# Patient Record
Sex: Female | Born: 1940 | Race: White | Hispanic: No
Health system: Southern US, Community
[De-identification: ages and names within clinical notes are randomized; demographics above are authoritative.]

## PROBLEM LIST (undated history)

## (undated) DIAGNOSIS — F0390 Unspecified dementia without behavioral disturbance: Secondary | ICD-10-CM

---

## 2018-06-28 ENCOUNTER — Emergency Department (HOSPITAL_COMMUNITY): Payer: Medicare Other

## 2018-06-28 ENCOUNTER — Emergency Department (HOSPITAL_COMMUNITY)
Admission: EM | Admit: 2018-06-28 | Discharge: 2018-07-04 | Disposition: A | Discharge status: Other Acute Inpatient Hospital | Payer: Medicare Other | Attending: Emergency Medicine | Admitting: Emergency Medicine

## 2018-06-28 ENCOUNTER — Encounter (HOSPITAL_COMMUNITY): Payer: Self-pay | Admitting: Emergency Medicine

## 2018-06-28 ENCOUNTER — Other Ambulatory Visit: Payer: Self-pay

## 2018-06-28 DIAGNOSIS — G3109 Other frontotemporal dementia: Secondary | ICD-10-CM | POA: Diagnosis not present

## 2018-06-28 DIAGNOSIS — Z79899 Other long term (current) drug therapy: Secondary | ICD-10-CM | POA: Diagnosis not present

## 2018-06-28 DIAGNOSIS — F322 Major depressive disorder, single episode, severe without psychotic features: Secondary | ICD-10-CM | POA: Insufficient documentation

## 2018-06-28 DIAGNOSIS — R4689 Other symptoms and signs involving appearance and behavior: Secondary | ICD-10-CM

## 2018-06-28 DIAGNOSIS — F332 Major depressive disorder, recurrent severe without psychotic features: Secondary | ICD-10-CM

## 2018-06-28 DIAGNOSIS — F988 Other specified behavioral and emotional disorders with onset usually occurring in childhood and adolescence: Secondary | ICD-10-CM | POA: Insufficient documentation

## 2018-06-28 DIAGNOSIS — S0990XA Unspecified injury of head, initial encounter: Secondary | ICD-10-CM

## 2018-06-28 DIAGNOSIS — R45851 Suicidal ideations: Secondary | ICD-10-CM | POA: Insufficient documentation

## 2018-06-28 DIAGNOSIS — Z7982 Long term (current) use of aspirin: Secondary | ICD-10-CM | POA: Diagnosis not present

## 2018-06-28 DIAGNOSIS — S0081XA Abrasion of other part of head, initial encounter: Secondary | ICD-10-CM | POA: Diagnosis not present

## 2018-06-28 DIAGNOSIS — Y92129 Unspecified place in nursing home as the place of occurrence of the external cause: Secondary | ICD-10-CM | POA: Insufficient documentation

## 2018-06-28 DIAGNOSIS — Y939 Activity, unspecified: Secondary | ICD-10-CM | POA: Diagnosis not present

## 2018-06-28 DIAGNOSIS — W1839XA Other fall on same level, initial encounter: Secondary | ICD-10-CM | POA: Diagnosis not present

## 2018-06-28 DIAGNOSIS — E876 Hypokalemia: Secondary | ICD-10-CM

## 2018-06-28 DIAGNOSIS — Y999 Unspecified external cause status: Secondary | ICD-10-CM | POA: Diagnosis not present

## 2018-06-28 DIAGNOSIS — S0083XA Contusion of other part of head, initial encounter: Secondary | ICD-10-CM | POA: Diagnosis not present

## 2018-06-28 DIAGNOSIS — W19XXXA Unspecified fall, initial encounter: Secondary | ICD-10-CM

## 2018-06-28 DIAGNOSIS — Z23 Encounter for immunization: Secondary | ICD-10-CM | POA: Insufficient documentation

## 2018-06-28 HISTORY — DX: Unspecified dementia, unspecified severity, without behavioral disturbance, psychotic disturbance, mood disturbance, and anxiety: F03.90

## 2018-06-28 LAB — BASIC METABOLIC PANEL
ANION GAP: 12 (ref 5–15)
BUN: 20 mg/dL (ref 8–23)
CALCIUM: 8.9 mg/dL (ref 8.9–10.3)
CO2: 28 mmol/L (ref 22–32)
CREATININE: 0.93 mg/dL (ref 0.44–1.00)
Chloride: 99 mmol/L (ref 98–111)
GFR calc Af Amer: >60 mL/min (ref >60)
GFR, EST NON AFRICAN AMERICAN: 58 mL/min — AB (ref >60)
GLUCOSE: 122 mg/dL — AB (ref 70–99)
Potassium: 2.8 mmol/L — ABNORMAL LOW (ref 3.5–5.1)
Sodium: 139 mmol/L (ref 135–145)

## 2018-06-28 LAB — CBC WITH DIFFERENTIAL/PLATELET
Abs Immature Granulocytes: 0.02 10*3/uL (ref 0.00–0.07)
BASOS PCT: 1 %
Basophils Absolute: 0 10*3/uL (ref 0.0–0.1)
EOS ABS: 0.1 10*3/uL (ref 0.0–0.5)
EOS PCT: 2 %
HCT: 42 % (ref 36.0–46.0)
Hemoglobin: 14.1 g/dL (ref 12.0–15.0)
IMMATURE GRANULOCYTES: 0 %
Lymphocytes Relative: 29 %
Lymphs Abs: 1.7 10*3/uL (ref 0.7–4.0)
MCH: 29.5 pg (ref 26.0–34.0)
MCHC: 33.6 g/dL (ref 30.0–36.0)
MCV: 87.9 fL (ref 80.0–100.0)
MONOS PCT: 10 %
Monocytes Absolute: 0.6 10*3/uL (ref 0.1–1.0)
NEUTROS PCT: 58 %
NRBC: 0 % (ref 0.0–0.2)
Neutro Abs: 3.4 10*3/uL (ref 1.7–7.7)
PLATELETS: 198 10*3/uL (ref 150–400)
RBC: 4.78 MIL/uL (ref 3.87–5.11)
RDW: 13 % (ref 11.5–15.5)
WBC: 5.9 10*3/uL (ref 4.0–10.5)

## 2018-06-28 LAB — URINALYSIS, ROUTINE W REFLEX MICROSCOPIC
BACTERIA UA: NONE SEEN
Bilirubin Urine: NEGATIVE
Glucose, UA: NEGATIVE mg/dL
Ketones, ur: 5 mg/dL — AB
LEUKOCYTES UA: NEGATIVE
Nitrite: NEGATIVE
PH: 5 (ref 5.0–8.0)
Protein, ur: NEGATIVE mg/dL
SPECIFIC GRAVITY, URINE: 1.023 (ref 1.005–1.030)

## 2018-06-28 MED ORDER — POTASSIUM CHLORIDE CRYS ER 20 MEQ PO TBCR
40.0000 meq | EXTENDED_RELEASE_TABLET | Freq: Once | ORAL | Status: AC
  Administered 2018-07-01: 40 meq via ORAL
  Filled 2018-06-28 (×3): qty 2

## 2018-06-28 MED ORDER — POTASSIUM CHLORIDE 10 MEQ/100ML IV SOLN
10.0000 meq | INTRAVENOUS | Status: AC
  Administered 2018-06-28 (×2): 10 meq via INTRAVENOUS
  Filled 2018-06-28 (×2): qty 100

## 2018-06-28 MED ORDER — TETANUS-DIPHTH-ACELL PERTUSSIS 5-2.5-18.5 LF-MCG/0.5 IM SUSP
0.5000 mL | Freq: Once | INTRAMUSCULAR | Status: AC
  Administered 2018-06-28: 0.5 mL via INTRAMUSCULAR
  Filled 2018-06-28: qty 0.5

## 2018-06-28 MED ORDER — HALOPERIDOL LACTATE 5 MG/ML IJ SOLN
2.0000 mg | Freq: Once | INTRAMUSCULAR | Status: AC
  Administered 2018-06-28: 2 mg via INTRAVENOUS
  Filled 2018-06-28: qty 1

## 2018-06-28 MED ORDER — SODIUM CHLORIDE 0.9 % IV BOLUS
1000.0000 mL | Freq: Once | INTRAVENOUS | Status: AC
  Administered 2018-06-28: 1000 mL via INTRAVENOUS

## 2018-06-28 MED ORDER — LORAZEPAM 2 MG/ML IJ SOLN
1.0000 mg | INTRAMUSCULAR | Status: DC | PRN
  Administered 2018-07-01: 1 mg via INTRAMUSCULAR
  Filled 2018-06-28 (×3): qty 1

## 2018-06-28 MED ORDER — HALOPERIDOL LACTATE 5 MG/ML IJ SOLN
2.0000 mg | Freq: Four times a day (QID) | INTRAMUSCULAR | Status: DC | PRN
  Administered 2018-07-01 – 2018-07-03 (×4): 2 mg via INTRAMUSCULAR
  Filled 2018-06-28 (×4): qty 1

## 2018-06-28 MED ORDER — LORAZEPAM 2 MG/ML IJ SOLN
1.0000 mg | Freq: Once | INTRAMUSCULAR | Status: AC
  Administered 2018-06-28: 1 mg via INTRAVENOUS
  Filled 2018-06-28: qty 1

## 2018-06-28 NOTE — ED Triage Notes (Signed)
Per EMS, pt coming from [facility] nursing home with complaints of a fall after pt was seen walking quickly away from CNA staff and fell hitting her head leaving a hematoma above left eye. Pt has dementia and per staff she is at baseline now which is non verbal. Pt is not on blood thinners at this time. VSS.

## 2018-06-28 NOTE — Progress Notes (Signed)
CSW met with pt and pt's family members. Pt did not participate in conversation. CSW provided pt's family with [place] information. Reviewed that at this time, seeking geri-psych. Explained that pt may have to go back to [facility] and seek new memory care placement from there. Pt's family agreeable and understanding.   Wendelyn Breslow, [facility]  [phone]
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
## 2018-06-28 NOTE — ED Notes (Signed)
ED Provider at bedside. 

## 2018-06-28 NOTE — ED Notes (Signed)
Patient transported to CT 
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
## 2018-06-28 NOTE — Discharge Instructions (Addendum)
By Kathryne Sharper to [facility]

## 2018-06-29 MED ORDER — ASPIRIN 81 MG PO CHEW
81.0000 mg | CHEWABLE_TABLET | Freq: Every day | ORAL | Status: DC
  Administered 2018-06-29 – 2018-07-04 (×5): 81 mg via ORAL
  Filled 2018-06-29 (×6): qty 1

## 2018-06-29 MED ORDER — BUSPIRONE HCL 10 MG PO TABS
5.0000 mg | ORAL_TABLET | ORAL | Status: DC
  Filled 2018-06-29: qty 1

## 2018-06-29 MED ORDER — BUSPIRONE HCL 10 MG PO TABS
5.0000 mg | ORAL_TABLET | Freq: Two times a day (BID) | ORAL | Status: DC
  Administered 2018-06-29 – 2018-07-03 (×8): 5 mg via ORAL
  Filled 2018-06-29 (×7): qty 1

## 2018-06-29 MED ORDER — VITAMIN D3 50 MCG (2000 UT) PO TABS: 4000.0000 [IU] | ORAL_TABLET | Freq: Every day | ORAL | Status: DC

## 2018-06-29 MED ORDER — VORTIOXETINE HBR 5 MG PO TABS
5.0000 mg | ORAL_TABLET | Freq: Every day | ORAL | Status: DC
  Administered 2018-06-29 – 2018-07-04 (×5): 5 mg via ORAL
  Filled 2018-06-29 (×6): qty 1

## 2018-06-29 MED ORDER — SENNA-DOCUSATE SODIUM 8.6-50 MG PO TABS: 1.0000 | ORAL_TABLET | ORAL | Status: DC

## 2018-06-29 MED ORDER — HYDROCHLOROTHIAZIDE 25 MG PO TABS
25.0000 mg | ORAL_TABLET | Freq: Every day | ORAL | Status: DC
  Administered 2018-06-29 – 2018-07-04 (×6): 25 mg via ORAL
  Filled 2018-06-29 (×6): qty 1

## 2018-06-29 MED ORDER — CALCIUM CITRATE 250 MG PO TABS: 250.0000 mg | ORAL_TABLET | ORAL | Status: DC

## 2018-06-29 MED ORDER — PANTOPRAZOLE SODIUM 40 MG PO TBEC
40.0000 mg | DELAYED_RELEASE_TABLET | Freq: Every day | ORAL | Status: DC
  Administered 2018-06-29 – 2018-07-04 (×6): 40 mg via ORAL
  Filled 2018-06-29 (×6): qty 1

## 2018-06-29 MED ORDER — TRAZODONE HCL 100 MG PO TABS
100.0000 mg | ORAL_TABLET | Freq: Every day | ORAL | Status: DC
  Administered 2018-06-29 – 2018-07-02 (×3): 100 mg via ORAL
  Filled 2018-06-29 (×4): qty 1

## 2018-06-29 MED ORDER — POTASSIUM CHLORIDE CRYS ER 20 MEQ PO TBCR
40.0000 meq | EXTENDED_RELEASE_TABLET | Freq: Once | ORAL | Status: AC
  Administered 2018-06-29: 40 meq via ORAL
  Filled 2018-06-29: qty 2

## 2018-06-29 MED ORDER — TRAZODONE HCL 100 MG PO TABS
100.0000 mg | ORAL_TABLET | ORAL | Status: DC
  Filled 2018-06-29: qty 1

## 2018-06-29 MED ORDER — VITAMIN D 1000 UNITS PO TABS
4000.0000 [IU] | ORAL_TABLET | Freq: Every day | ORAL | Status: DC
  Administered 2018-06-29 – 2018-07-04 (×6): 4000 [IU] via ORAL
  Filled 2018-06-29 (×6): qty 4

## 2018-06-29 MED ORDER — LEVOTHYROXINE SODIUM 50 MCG PO TABS
50.0000 ug | ORAL_TABLET | Freq: Every day | ORAL | Status: DC
  Administered 2018-07-01 – 2018-07-02 (×2): 50 ug via ORAL
  Filled 2018-06-29 (×4): qty 1

## 2018-06-29 MED ORDER — ALPRAZOLAM 0.5 MG PO TABS
0.5000 mg | ORAL_TABLET | ORAL | Status: DC | PRN
  Administered 2018-07-02: 0.5 mg via ORAL

## 2018-06-29 MED ORDER — CALCIUM CITRATE 950 (200 CA) MG PO TABS
200.0000 mg | ORAL_TABLET | Freq: Two times a day (BID) | ORAL | Status: DC
  Administered 2018-06-29 – 2018-07-04 (×7): 200 mg via ORAL
  Filled 2018-06-29 (×10): qty 1

## 2018-06-29 MED ORDER — ALPRAZOLAM ER 1 MG PO TB24
3.0000 mg | ORAL_TABLET | ORAL | Status: DC
  Filled 2018-06-29: qty 3

## 2018-06-29 MED ORDER — SERTRALINE HCL 100 MG PO TABS
100.0000 mg | ORAL_TABLET | Freq: Every day | ORAL | Status: DC
  Administered 2018-06-29 – 2018-07-04 (×6): 100 mg via ORAL
  Filled 2018-06-29 (×6): qty 1

## 2018-06-29 MED ORDER — SENNOSIDES-DOCUSATE SODIUM 8.6-50 MG PO TABS: 1.0000 | ORAL_TABLET | ORAL | Status: DC | PRN

## 2018-06-29 MED ORDER — ACETAMINOPHEN 500 MG PO TABS: 500.0000 mg | ORAL_TABLET | Freq: Four times a day (QID) | ORAL | Status: DC | PRN

## 2018-06-29 NOTE — ED Notes (Signed)
Pt dinner tray ordered.

## 2018-06-29 NOTE — ED Notes (Signed)
If any issue please call [phone] son Aarion Kittrell and is POA

## 2018-06-29 NOTE — BH Assessment (Signed)
[facility] Assessment Progress Note  Clinician attempted to reassess pt. Spoke to RN, Tre', who asked pt if she would participate in reassessment. Pt said that she would NOT participate but simply saying "no". TTS will attempt to reassess later in the day with a face to face visit.   Johny Shock. Ladona Ridgel, MS, NCC, LPCA Counselor

## 2018-06-29 NOTE — ED Notes (Signed)
Family at bedside, eating food and medications given with assistance of family, pt in chair and cooperative at this time

## 2018-06-29 NOTE — ED Notes (Signed)
Rec'd call from patient assessment. Pt refused to complete TTS. When patient asked if she wanted to speak to the provider, pt replied "No," turning over and closing eyes to go back to sleep.

## 2018-06-29 NOTE — ED Notes (Signed)
wanded by security 

## 2018-06-29 NOTE — ED Notes (Signed)
Regular Diet was ordered for Breakfast. 

## 2018-06-29 NOTE — ED Notes (Signed)
Family at bedside. Pt ambulate to bathroom with family assist

## 2018-06-29 NOTE — ED Notes (Signed)
Regular diet lunch tray ordered 

## 2018-06-29 NOTE — BH Assessment (Signed)
NP Nira Conn recommends gero-psych inpatient treatment. Patient referred to the following hospitals: (pending review)  Pharmacist, hospital Medical  [facility] [place]

## 2018-06-29 NOTE — ED Notes (Signed)
Pt ate 100% of dinner tray, family present

## 2018-06-29 NOTE — ED Notes (Signed)
MD informed that pt is refusing meds and refusing to eat. Instructed to continue to encourage vortioxetine.

## 2018-06-29 NOTE — ED Notes (Signed)
Pt woke up and was walking around unit, pushed past RN to get through door but eventually redirected to closed unit, escorted back to bed by nursing staff, now resting in bed.

## 2018-06-30 MED ORDER — ENSURE ENLIVE PO LIQD
237.0000 mL | Freq: Two times a day (BID) | ORAL | Status: DC
  Administered 2018-06-30 – 2018-07-03 (×6): 237 mL via ORAL
  Filled 2018-06-30 (×7): qty 237

## 2018-06-30 MED ORDER — ALPRAZOLAM 0.5 MG PO TABS
1.0000 mg | ORAL_TABLET | Freq: Three times a day (TID) | ORAL | Status: DC
  Administered 2018-06-30 – 2018-07-04 (×9): 1 mg via ORAL
  Filled 2018-06-30 (×12): qty 2

## 2018-06-30 NOTE — ED Notes (Signed)
Magistrate verified she received the IVC papers.

## 2018-06-30 NOTE — ED Notes (Signed)
Son visited wpt. Pt did not wake up. Attempted to wake pt so may eat lunch - unable. Pt noted to be lying on bed, w/eyes closed. Respirations even, unlabored. Son advised he has Health Care POA paperwork that he will bring at next visitation time d/t brought General POA paperwork yesterday - copy of this faxed to [facility], copy sent to medical records, and copy placed on clipboard.

## 2018-06-30 NOTE — ED Notes (Signed)
Pt took meds - refuses to eat even after much encouragement.

## 2018-06-30 NOTE — ED Notes (Signed)
Pt ambulated to bathroom w/Sitter.

## 2018-06-30 NOTE — ED Notes (Signed)
Pt drank half of her ensure.

## 2018-06-30 NOTE — ED Notes (Signed)
Re-TTS completed.  

## 2018-06-30 NOTE — ED Notes (Signed)
Pt ambulating in hallway w/Sitter.

## 2018-06-30 NOTE — ED Notes (Signed)
Laurie Hartman visited mother.

## 2018-06-30 NOTE — ED Notes (Signed)
Patient is resting with sitting at bedside

## 2018-06-30 NOTE — ED Notes (Signed)
Pt's family leaving at this time. Pt ate 100% of dinner and apple sauce. Pt continues not to speak.

## 2018-06-30 NOTE — Progress Notes (Signed)
According to Assunta Found, NP, the patient has been recommended for inpatient gero-psych services.   Disposition CSW contacted the patient's son/Healthcare POA, Tiwanna Tuch [phone]) to determine whether or not he would like his mother to be IVC'd due to her dementia diagnosis. According to the patient's son, he is agreeable with his mother being IVC'd. Patient's son reports he would like an update once his mother has been accepted to a facility   Disposition CSW will continue to follow for possible placement and updates.    Jerrol Banana, RN notified.     Baldo Daub, MSW, LCSWA Clinical Social Worker [facility]  Phone: [phone]

## 2018-06-30 NOTE — ED Notes (Signed)
Patient wandering unit and enters another patients room; sitters attempted to redirect patient out of room patient beings attempting to strike staff; RN and EDP intervene and able to redirect patient back to her room; pt is  Lying in bed at this time-Monique,RN

## 2018-06-30 NOTE — ED Notes (Signed)
Patient pacing the unit at this time; patient pushes and shoving sitter to get out of door; pt was able to be redirect back to her room-Monique,RN

## 2018-06-30 NOTE — BH Assessment (Signed)
06/30/2018:  Reassessment Patient awake and alert, however would not respond to questions.  Nurse reports Patient is not communicating with them, nor will she eat.  Nurse reports Patient will open her eyes and believes she can hear what is said to her.    Per Assunta Found, NP; Patient needs geropsych placement.

## 2018-06-30 NOTE — ED Notes (Signed)
IVC papers were served.  Original was placed in magistrate folder.  Copy faxed to [facility]  1 copy to medical records and 3 copies on patient's clipboard.

## 2018-06-30 NOTE — ED Notes (Signed)
Pt's son, Shon Baton, and significant other - Elizabeth visiting w/pt. Son brought in Health Care POA paperwork - copy faxed to [facility], copy sent to Medical Records, copy placed on clipboard.

## 2018-06-30 NOTE — ED Notes (Signed)
Pt woke, ambulated to bathroom w/Sitter and back to room. Sitter is assisting pt w/eating.

## 2018-07-01 NOTE — ED Notes (Signed)
Pt noted w/discoloration to face - bil eyes, nose. No open areas noted.

## 2018-07-01 NOTE — ED Notes (Signed)
Pt continues to attempt to leave ED - staff redirecting.

## 2018-07-01 NOTE — ED Notes (Signed)
Ambulating in hallway - Sitter w/pt. Pt noted to be irritable.

## 2018-07-01 NOTE — B[facility] Counselor (Signed)
Attempted reassessment -- Pt again refused to participate.  Eyes were closed. Pt appeared to be resting comfortably.

## 2018-07-01 NOTE — ED Notes (Signed)
Patient has gotten up x 3 attempting to walk out; patient has become aggressive and hitting sitters; PRN medication administered; pt refused all po meds-Monique,RN

## 2018-07-01 NOTE — ED Notes (Addendum)
Pt left room again, attempted to enter other pt's room, then shut herself in bathroom. Pt encouraged to come out and escorted back to her room. Laying down again at this time.

## 2018-07-01 NOTE — ED Notes (Signed)
Pt took med w/o difficulty. Sitter attempted to assist pt w/eating - pt ate few bites then turned over in bed.

## 2018-07-01 NOTE — Progress Notes (Signed)
Patient  Continues to meet criteria for geriatric inpatient treatment. CSW faxed referrals to the following facilities for review:  [place], [facility], [facility] [place], [place], [place], [place], [place]. [facility], [place].  TTS will continue to seek bed placement.   Trula Slade, MSW, LCSW Clinical Social Worker 07/01/2018 8:54 AM

## 2018-07-01 NOTE — ED Notes (Signed)
Pt ate 100% of lunch w/encouragement from son. Pt ambulated in hallway w/son then returned to room. Pt's son has left and advised will return at 1730 visitation time. Pt ambulating in hallway w/Sitter. Pt attempts to leave ED - redirected by staff.

## 2018-07-01 NOTE — ED Notes (Addendum)
Pt ambulated to bathroom and back to room w/Sitter. Pt noted w/discoloration to face - bil eyes and nose.

## 2018-07-01 NOTE — ED Notes (Signed)
Pt walked around briefly under supervision of her sitter, this tech, RN, and additional techs. Pt became agitated and was escorted back to room. Pt continues to be agitated at this time, attempting to push and hit sitter. RN aware. Pt encouraged to return to bed.

## 2018-07-01 NOTE — ED Notes (Signed)
Pt attempted to hit Sitter w/fist. Pt pushing another staff member. Pt re-directed to room. Pt laid down on bed. Prior to Haldol being given, RN asked pt if she preferred for injection to be given in her arm or leg. Pt stated "Leg" and pointed to left thigh. Pt tolerated injection well.

## 2018-07-01 NOTE — ED Notes (Signed)
Pt dinner tray ordered.

## 2018-07-01 NOTE — ED Notes (Signed)
Pt continues to attempt to hit staff.

## 2018-07-01 NOTE — ED Notes (Signed)
Son visiting w/pt - assisting pt w/eating lunch.

## 2018-07-01 NOTE — ED Notes (Signed)
Son encouraging pt to eat dinner.

## 2018-07-01 NOTE — ED Notes (Signed)
Per report, pt was physically aggressive w/staff last shift.

## 2018-07-01 NOTE — ED Notes (Signed)
Pt took her medications with no problems. Shortly after taking meds the pt stated "I don't need your kind of help" and pushed this RN. Pt them took the computer on wheels and started to walk with it, using same as a walker. After about 6' the pt pushed the computer on wheels into the wall and swung at this RN. Pt never made any contact with this RN and then walked back to her room. Pt then pushed and swung at the sitter multiple times. Collaborative decision made with sitter who had been with pt previously to give agitation medications. Sitter advised when she gets like this that those medications are the only things that will calm her down. Took this information into consideration and administered medications as later noted.

## 2018-07-01 NOTE — ED Notes (Signed)
Son and significant other visiting w/pt.

## 2018-07-01 NOTE — ED Notes (Signed)
Staff attempting to bathe pt d/t pt noted to have BM in bathroom and did not cleanse herself.

## 2018-07-01 NOTE — ED Notes (Addendum)
Pt attempted to shut door to bathroom on Sitter. Pt attempting to leave ED and attempting to walk in other pt's rooms - redirected by [facility] and RN. When pt returned to her room, threw blanket at [place].

## 2018-07-02 DIAGNOSIS — F332 Major depressive disorder, recurrent severe without psychotic features: Secondary | ICD-10-CM | POA: Diagnosis not present

## 2018-07-02 NOTE — Progress Notes (Signed)
Pt. meets criteria for inpatient treatment per Fransisca Kaufmann, NP.  Referred out to the following hospitals: [facility]  [facility]  [facility]  [facility]  [facility]    Disposition CSW will continue to follow for placement.  Timmothy Euler. Kaylyn Lim, MSW, LCSWA Disposition Clinical Social Work [phone] (cell) [phone] (office)

## 2018-07-02 NOTE — ED Notes (Addendum)
Unable to assess patients AM mood/behavior, had blank stare, does not answer questions, closed eyes upon further questioning. Remains calm. Sitter tried to feed patient, took 4 bites and then wouldn't open her mouth. Asked if she wanted more food by sitter and patient said, "No."

## 2018-07-02 NOTE — ED Notes (Signed)
Pts son at bedside visiting mother.

## 2018-07-02 NOTE — ED Notes (Signed)
Laurie Hartman, patients, son's girlfriend at bedside during TTS.

## 2018-07-02 NOTE — ED Notes (Signed)
Patient having tts done at this time.

## 2018-07-02 NOTE — ED Notes (Signed)
Pt began to wander in hallway, able to be redirected.

## 2018-07-02 NOTE — Consult Note (Signed)
  Patient is seen and chart is reviewed. Laurie Hartman was non-verbal during assessment today but appeared to be alert. Her daughter in law Marisue Ivan was present at the bedside. Per review of chart the patient has a psychiatric history of depression currently taking Buspar, Xanax, Zoloft, and Trintellix to manage the symptoms. Her daughter in law reports that over the last few weeks patient has "started making frequent comments about wanting to end her life. She has also become more agitated." The patient also per report of family has a history of frontal lobe dementia. Discussed case with TTS staff. At this time the patient continues to meet criteria for gero-psych placement. TTS staff will continue to seek placement.

## 2018-07-02 NOTE — ED Notes (Signed)
Pt's son at bedside helping feed pt. Aware of visiting hours and will be leaving shortly

## 2018-07-02 NOTE — ED Notes (Signed)
Regular diet lunch tray ordered for pt

## 2018-07-03 ENCOUNTER — Encounter (HOSPITAL_COMMUNITY): Payer: Self-pay | Admitting: Registered Nurse

## 2018-07-03 NOTE — ED Notes (Signed)
Pt sitting w/Sitter.

## 2018-07-03 NOTE — ED Notes (Signed)
Pt hitting staff. Throwing drink and food at staff. Haldol given - pt tolerated well.

## 2018-07-03 NOTE — ED Notes (Signed)
RN attempted to feed patient applesauce to take meds; pt refused meds and applesauce; patient is currently lying in bed with her eyes open, breathing in normal and no acute distress noted;patient does answer questions with a yes or no; no other communication to staff if not initiated; patient vitals WNL.-Monique,RN

## 2018-07-03 NOTE — ED Notes (Signed)
Regular Diet was ordered for Lunch. 

## 2018-07-03 NOTE — ED Notes (Signed)
Pt ambulatory in hallway - attempting to leave - hit Sitter - redirected to room.

## 2018-07-03 NOTE — ED Notes (Signed)
Regular Diet was ordered for Dinner. 

## 2018-07-03 NOTE — ED Notes (Signed)
Pt sitting in chair - Sitter w/pt.

## 2018-07-03 NOTE — Progress Notes (Addendum)
CSW contacted by [facility] and asked to fax patient's referral information again.  Timmothy Euler. Kaylyn Lim, MSW, LCSWA Disposition Clinical Social Work [phone] (cell) [phone] (office)   Addendum: 07/04/18 [facility] re possible admission.  They are declining patient due to aggressive behaviors and high acuity on their unit.Carney Bern T. Kaylyn Lim, MSW, LCSWA Disposition Clinical Social Work [phone] (cell) [phone] (office)

## 2018-07-03 NOTE — ED Notes (Signed)
Pt in shower - Sitter assisting.

## 2018-07-03 NOTE — ED Notes (Signed)
Breakfast tray ordered 

## 2018-07-03 NOTE — ED Notes (Signed)
Pt ambulated out of room - attempted to hit staff - escorted pt back to room.

## 2018-07-03 NOTE — ED Notes (Signed)
Pt continues to attempt to leave - redirected by staff. Pt noted to be hitting staff. Encouraging appropriate behavior. Sitter assisting pt w/eating lunch.

## 2018-07-03 NOTE — ED Notes (Signed)
Pt ate 100% of breakfast w/assistance from staff.

## 2018-07-03 NOTE — ED Notes (Signed)
Pt out into the hallway, redirected back to room by sitter

## 2018-07-03 NOTE — ED Notes (Signed)
Encouraged pt to eat breakfast - states "not right now".

## 2018-07-04 NOTE — ED Notes (Signed)
[place], son at bedside, updated on pts status and behavior today, attempting to assist pt with eating and this RN asked to have assistance with med administration before he leaves from his visit today

## 2018-07-04 NOTE — ED Provider Notes (Signed)
2:47 PM transfer to [facility]. Appears medically stable. Of note is hypokalemic, will need re-check and potassium supplementation while at facility.   Pricilla Loveless, MD 07/04/18 1447

## 2018-07-04 NOTE — ED Notes (Signed)
Pt attempting to leave unit x 3, pt walked out of unit with this RN accompanying her and walked through the department and was able to be directed back into her room, the pt then walked out of the room and was not able to be redirected verbally and this RN attempted to guide by the arm, the Pt hit at the door and this RN and left the unit, security present, the pt then hit security in the chest with her fists, this RN and security was able to guide the pt back to her room, pt cooperative at this time

## 2018-07-04 NOTE — ED Notes (Signed)
Called sheriff for transport.  

## 2018-07-04 NOTE — BH Assessment (Signed)
Reassessment Note: Note content available through chart review & discussion with pt's nurse. Patient is unable/refuses to participate in telepsych assessment. Pt is disoriented & requires redirection. She continues to have periods of agitation. Pt continues to meet geropsych placement.

## 2018-07-04 NOTE — ED Notes (Signed)
Patient refused a snack and drink. 

## 2018-07-04 NOTE — Progress Notes (Signed)
Pt accepted to [facility]  H. Sara Chu, MD, is the accepting/attending provider.  Call report to [phone] ask for [address] [facility] Psych ED notified.   Pt is IVC Pt may be transported by [facility] Pt scheduled  to arrive at [facility] as soon as transport can be arranged  Carney Bern T. Kaylyn Lim, MSW, LCSWA Disposition Clinical Social Work [phone] (cell) [phone] (office)

## 2018-07-04 NOTE — ED Notes (Signed)
Pt walked out of room and walked to the door to leave this RN stood in front of the door redirecting the pt verbally, the pt grabbed at my hands and shook them, the pt was told that it was not kind or acceptable to grab this RN and that she should follow me to her room and the pt followed this RN to her room, the pt then attempted to do the same thing and again grabbed this RN, the pt was able to be redirected back to her room, will continue to monitor

## 2018-07-04 NOTE — ED Notes (Signed)
Breakfast tray delivered to bedside.

## 2018-07-04 NOTE — ED Notes (Signed)
Pt up and attempting to go to the door to leave, pt redirected multiple times, pt threw a juice container at the wall, pt encouraged to remain calm, pt declines help with breakfast, pt states, " I don't need any help." pt at this time in the bed with her lights out, sitter present, will continue to monitor

## 2018-07-04 NOTE — Progress Notes (Addendum)
Pt. meets criteria for inpatient treatment per Nira Conn, NP.  Referred out to the following hospitals: [facility]  [facility]  [facility]  [facility]  [facility]     Disposition CSW will continue to follow for placement.  Timmothy Euler. Kaylyn Lim, MSW, LCSWA Disposition Clinical Social Work [phone] (cell) [phone] (office)  Also faxed to [facility] [place] under separate cover.

## 2018-07-04 NOTE — Progress Notes (Signed)
Chart reviewed. Patient is unable to participate in telepsych assessment. Per nursing staff patient is disoriented, requires redirection, and her thought process is not clear. Continues to have periods of agitation and refuses medications. At this time the patient continues to meet geropsych placement.

## 2018-07-04 NOTE — ED Notes (Signed)
Regular Diet was ordered for Lunch. 

## 2018-07-04 NOTE — ED Notes (Signed)
Laurie Hartman the pts son and POA requests to be updated on placement status phone number [phone]

## 2019-09-09 ENCOUNTER — Other Ambulatory Visit: Payer: Self-pay | Admitting: Nurse Practitioner

## 2019-11-18 ENCOUNTER — Other Ambulatory Visit: Payer: Self-pay | Admitting: Nurse Practitioner

## 2019-11-24 IMAGING — CT CT CERVICAL SPINE W/O CM
3 of 4 series · 13 of 33 positions shown, 16 images · non-contrast
Comparison: None.

CLINICAL DATA: Pain after fall

EXAM:
CT HEAD WITHOUT CONTRAST
CT CERVICAL SPINE WITHOUT CONTRAST
TECHNIQUE: Multidetector CT imaging of the head and cervical spine was
performed following the standard protocol without intravenous
contrast. Multiplanar CT image reconstructions of the cervical spine
were also generated.

[Series 5: orthogonal axial st · axial · 0.21mm/px · z∈[+1112,+1211]mm · 5 of 77 slices shown, 7 images]
[im 13/77  soft-tissue]
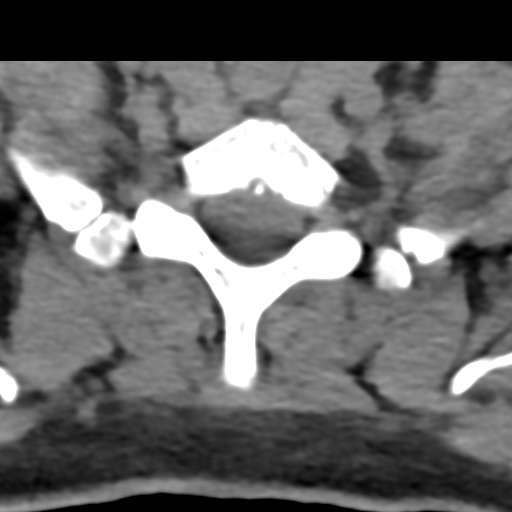
[im 13/77  bone]
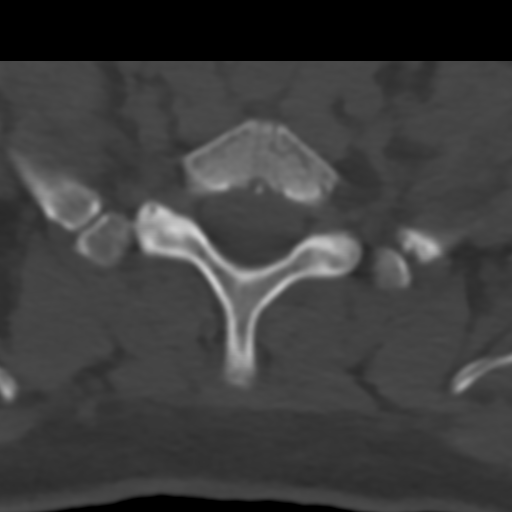
[im 26/77  bone]
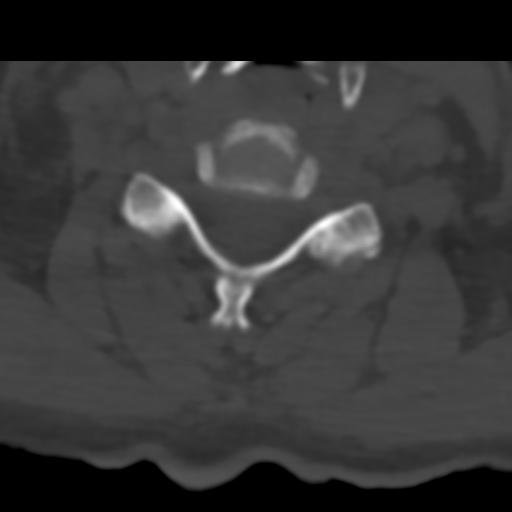
[im 39/77  bone]
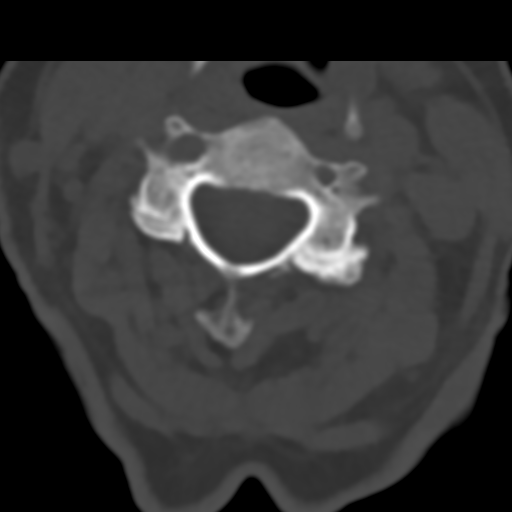
[im 51/77  bone]
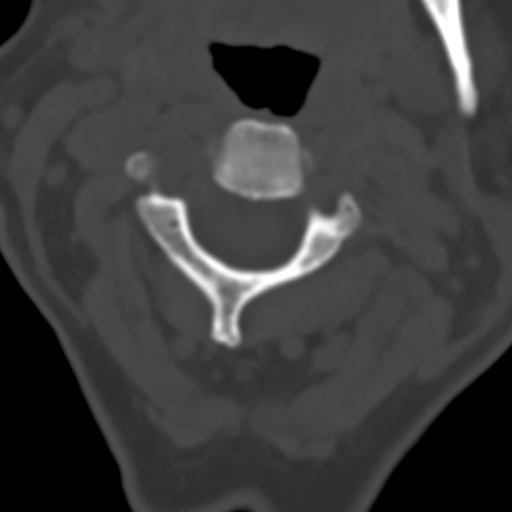
[im 64/77  soft-tissue]
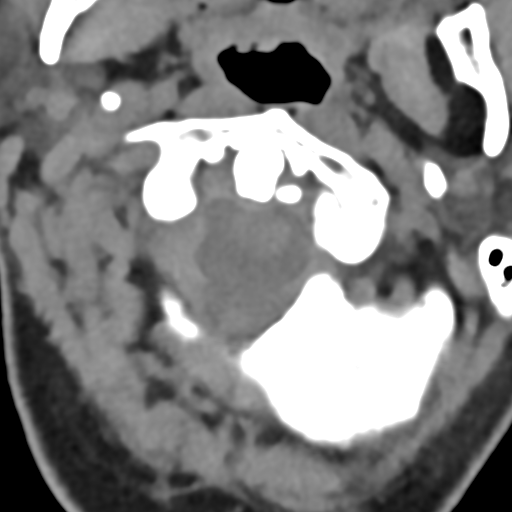
[im 64/77  bone]
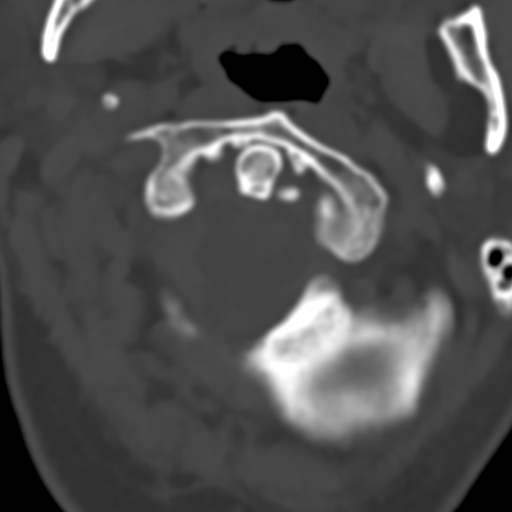

[Series 9: coronal bone · coronal · 0.23mm/px · 3 of 61 slices shown]
[im 13/61  bone]
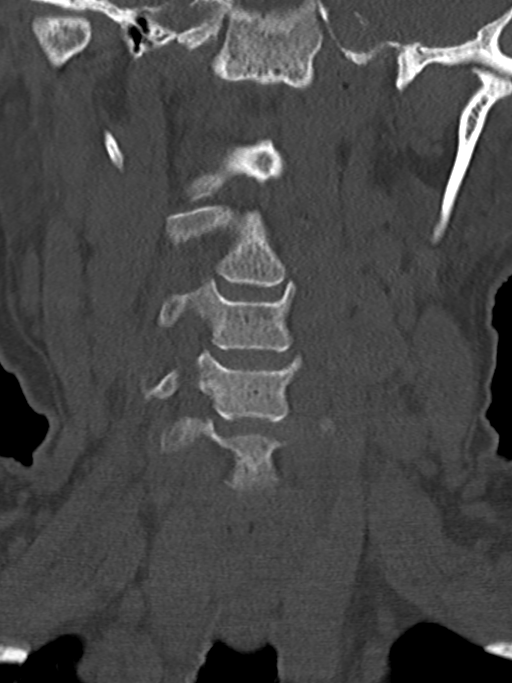
[im 25/61  bone]
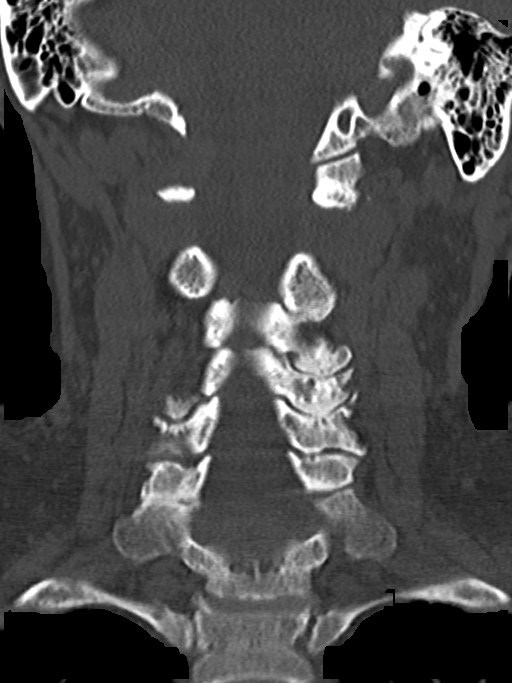
[im 37/61  bone]
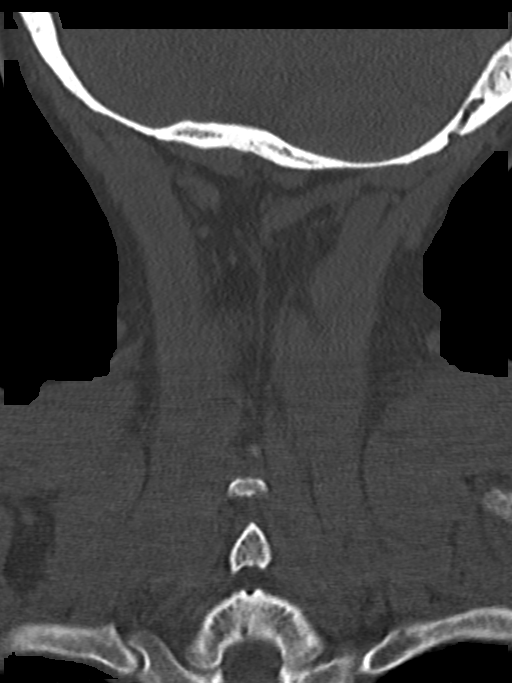

[Series 10: sagittal bone · sagittal · 0.23mm/px · 5 of 61 slices shown, 6 images]
[im 21/61  bone]
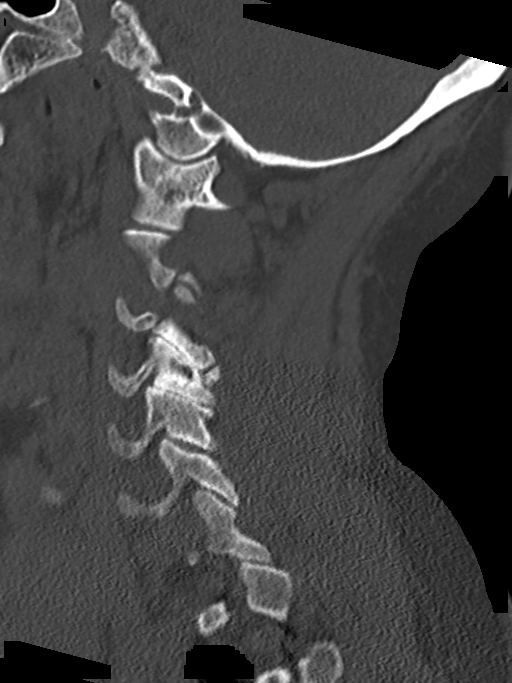
[im 26/61  bone]
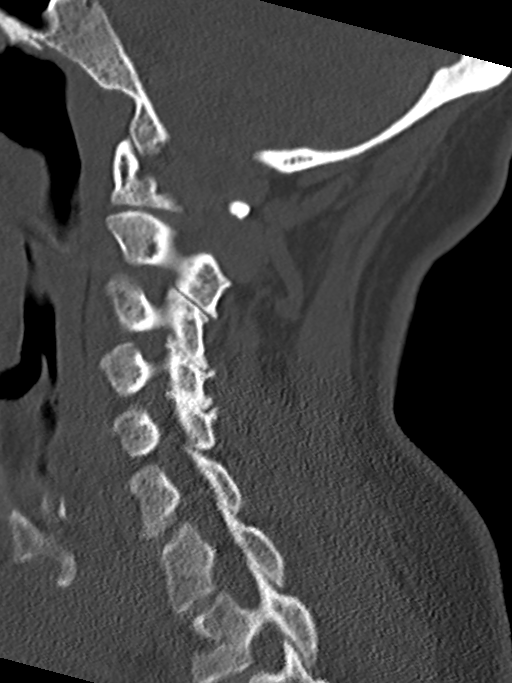
[im 31/61  soft-tissue]
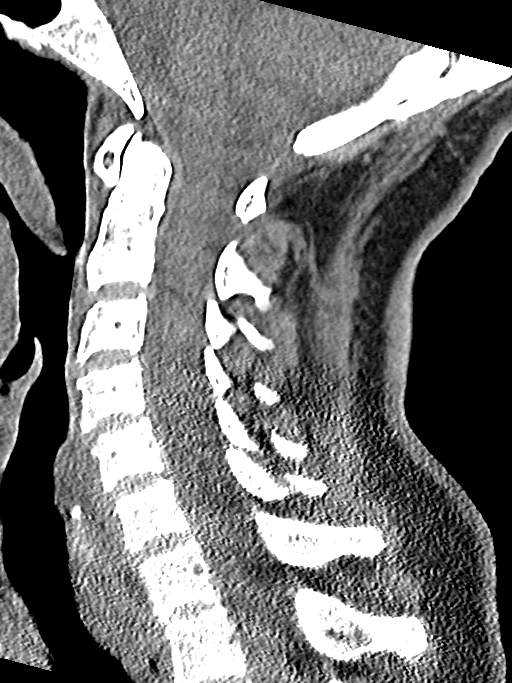
[im 31/61  bone]
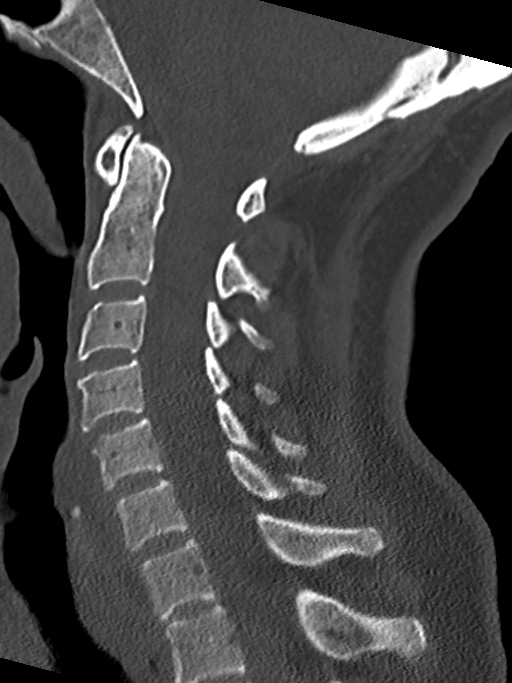
[im 36/61  bone]
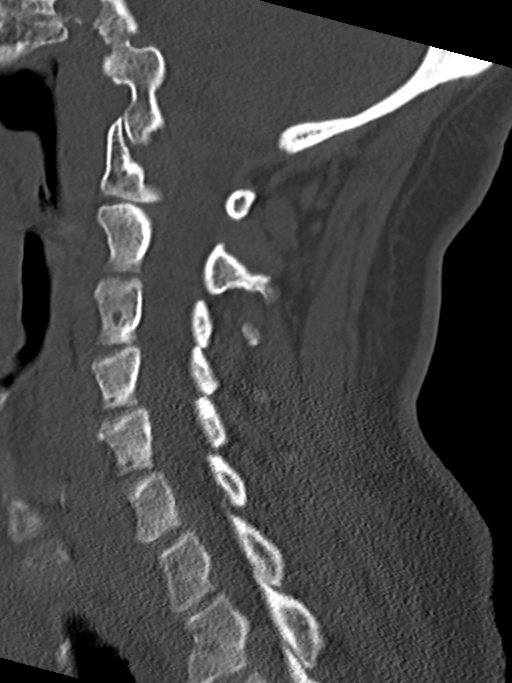
[im 41/61  bone]
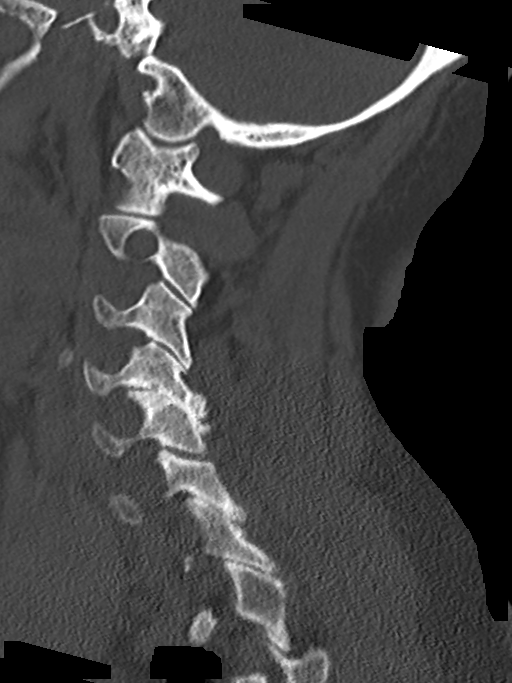

[13 of 33 positions shown; findings below may reference images not displayed]

FINDINGS: CT HEAD FINDINGS

Brain: Superficial and central atrophy with likely chronic minimal
small vessel ischemic disease of periventricular white matter. No
acute intracranial hemorrhage, midline shift or edema. No large
vascular territory infarct. No extra-axial fluid collections. No
intra-axial mass. Midline fourth ventricle and basal cisterns
without effacement. Brainstem and cerebellum are nonacute.

Vascular: No hyperdense vessel sign.

Skull: No acute skull fracture

Sinuses/Orbits: Intact

Other: Right frontal scalp hematoma. Minimally displaced left nasal
bone fracture.

CT CERVICAL SPINE FINDINGS

Alignment: Maintained cervical lordosis.

Skull base and vertebrae: Intact

Soft tissues and spinal canal: No prevertebral fluid or swelling. No
visible canal hematoma.

Disc levels: No significant central or foraminal stenosis. Mild
facet arthropathy C3-4, C4-5 on the left and C4-5 and C5-6 on the
right.

Upper chest: Clear lung apices.

Other: None
IMPRESSION: 1. Atrophy with likely chronic small vessel ischemic disease. No
acute intracranial abnormality.
2. Hematoma of the forehead without underlying skull fracture.
3. No acute cervical spine fracture or static listhesis. Bilateral
facet arthrosis.

## 2020-07-10 ENCOUNTER — Other Ambulatory Visit: Payer: Self-pay | Admitting: Nurse Practitioner

## 2020-08-19 DEATH — deceased
